# Patient Record
Sex: Male | Born: 1950 | Race: White | Hispanic: No | Marital: Married | Smoking: Current every day smoker
Health system: Southern US, Community
[De-identification: ages and names within clinical notes are randomized; demographics above are authoritative.]

---

## 2003-08-26 ENCOUNTER — Encounter: Payer: Self-pay | Admitting: Surgery

## 2003-09-01 ENCOUNTER — Ambulatory Visit (HOSPITAL_COMMUNITY): Admission: RE | Admit: 2003-09-01 | Discharge: 2003-09-01 | Payer: Self-pay | Admitting: Surgery

## 2011-12-20 ENCOUNTER — Ambulatory Visit (HOSPITAL_COMMUNITY)
Admission: RE | Admit: 2011-12-20 | Discharge: 2011-12-20 | Disposition: A | Discharge status: Home / Self Care | Payer: Self-pay | Source: Ambulatory Visit | Attending: Chiropractic Medicine | Admitting: Chiropractic Medicine

## 2011-12-20 ENCOUNTER — Other Ambulatory Visit (HOSPITAL_COMMUNITY): Payer: Self-pay | Admitting: Chiropractic Medicine

## 2011-12-20 DIAGNOSIS — M542 Cervicalgia: Secondary | ICD-10-CM

## 2011-12-20 DIAGNOSIS — M5030 Other cervical disc degeneration, unspecified cervical region: Secondary | ICD-10-CM | POA: Insufficient documentation

## 2012-05-18 ENCOUNTER — Ambulatory Visit
Admission: RE | Admit: 2012-05-18 | Discharge: 2012-05-18 | Disposition: A | Discharge status: Home / Self Care | Payer: Self-pay | Source: Ambulatory Visit | Attending: Geriatric Medicine | Admitting: Geriatric Medicine

## 2012-05-18 ENCOUNTER — Other Ambulatory Visit: Payer: Self-pay | Admitting: Geriatric Medicine

## 2012-05-18 DIAGNOSIS — F17200 Nicotine dependence, unspecified, uncomplicated: Secondary | ICD-10-CM

## 2015-06-08 ENCOUNTER — Telehealth: Payer: Self-pay | Admitting: Cardiovascular Disease

## 2015-06-08 NOTE — Telephone Encounter (Signed)
Received records from Dr Lerry Liner for appointment on 07/18/15 with Dr Allyson Sabal.  Records given to [facility] (medical records) for Dr Hazle Coca schedule on 07/18/15. lp

## 2015-07-18 ENCOUNTER — Encounter: Payer: Self-pay | Admitting: Cardiovascular Disease

## 2015-07-25 ENCOUNTER — Encounter: Payer: Self-pay | Admitting: Cardiovascular Disease

## 2018-04-08 ENCOUNTER — Emergency Department (HOSPITAL_COMMUNITY)
Admission: EM | Admit: 2018-04-08 | Discharge: 2018-04-08 | Disposition: A | Discharge status: Home / Self Care | Payer: Medicare HMO | Attending: Emergency Medicine | Admitting: Emergency Medicine

## 2018-04-08 ENCOUNTER — Encounter (HOSPITAL_COMMUNITY): Payer: Self-pay | Admitting: Emergency Medicine

## 2018-04-08 ENCOUNTER — Emergency Department (HOSPITAL_COMMUNITY): Payer: Medicare HMO

## 2018-04-08 DIAGNOSIS — F17210 Nicotine dependence, cigarettes, uncomplicated: Secondary | ICD-10-CM | POA: Insufficient documentation

## 2018-04-08 DIAGNOSIS — R1084 Generalized abdominal pain: Secondary | ICD-10-CM | POA: Diagnosis present

## 2018-04-08 LAB — URINALYSIS, ROUTINE W REFLEX MICROSCOPIC
Bacteria, UA: NONE SEEN
Bilirubin Urine: NEGATIVE
GLUCOSE, UA: NEGATIVE mg/dL
Ketones, ur: NEGATIVE mg/dL
Leukocytes, UA: NEGATIVE
Nitrite: NEGATIVE
PH: 6 (ref 5.0–8.0)
PROTEIN: NEGATIVE mg/dL
SPECIFIC GRAVITY, URINE: 1.011 (ref 1.005–1.030)

## 2018-04-08 LAB — COMPREHENSIVE METABOLIC PANEL
ALK PHOS: 78 U/L (ref 38–126)
ALT: 18 U/L (ref 17–63)
AST: 18 U/L (ref 15–41)
Albumin: 4.1 g/dL (ref 3.5–5.0)
Anion gap: 11 (ref 5–15)
BUN: 14 mg/dL (ref 6–20)
CALCIUM: 9.7 mg/dL (ref 8.9–10.3)
CHLORIDE: 101 mmol/L (ref 101–111)
CO2: 25 mmol/L (ref 22–32)
CREATININE: 0.94 mg/dL (ref 0.61–1.24)
GFR calc Af Amer: >60 mL/min (ref >60)
Glucose, Bld: 108 mg/dL — ABNORMAL HIGH (ref 65–99)
Potassium: 3.9 mmol/L (ref 3.5–5.1)
SODIUM: 137 mmol/L (ref 135–145)
Total Bilirubin: 0.7 mg/dL (ref 0.3–1.2)
Total Protein: 6.6 g/dL (ref 6.5–8.1)

## 2018-04-08 LAB — CBC
HCT: 39.2 % (ref 39.0–52.0)
Hemoglobin: 13.3 g/dL (ref 13.0–17.0)
MCH: 31.7 pg (ref 26.0–34.0)
MCHC: 33.9 g/dL (ref 30.0–36.0)
MCV: 93.3 fL (ref 78.0–100.0)
PLATELETS: 276 10*3/uL (ref 150–400)
RBC: 4.2 MIL/uL — ABNORMAL LOW (ref 4.22–5.81)
RDW: 13.4 % (ref 11.5–15.5)
WBC: 10.2 10*3/uL (ref 4.0–10.5)

## 2018-04-08 LAB — LIPASE, BLOOD: LIPASE: 26 U/L (ref 11–51)

## 2018-04-08 MED ORDER — IOPAMIDOL (ISOVUE-300) INJECTION 61%
INTRAVENOUS | Status: AC
  Filled 2018-04-08: qty 100

## 2018-04-08 MED ORDER — IOPAMIDOL (ISOVUE-300) INJECTION 61%
100.0000 mL | Freq: Once | INTRAVENOUS | Status: AC | PRN
  Administered 2018-04-08: 100 mL via INTRAVENOUS

## 2018-04-08 NOTE — ED Notes (Signed)
Patient transported to CT 

## 2018-04-08 NOTE — Discharge Instructions (Addendum)
Please read attached information. If you experience any new or worsening signs or symptoms please return to the emergency room for evaluation. Please follow-up with your primary care provider or specialist as discussed.  °

## 2018-04-08 NOTE — ED Notes (Signed)
Patient verbalizes understanding of discharge instructions. Opportunity for questioning and answers were provided. Armband removed by staff, pt discharged from ED ambulatory.   

## 2018-04-08 NOTE — ED Provider Notes (Signed)
[facility] EMERGENCY DEPARTMENT Provider Note   CSN: [id number] Arrival date & time: 04/08/18  1131     History   Chief Complaint Chief Complaint  Patient presents with  . Abdominal Pain    HPI Fernando Lynn is a 67 y.o. male.  HPI   67  Year old male presents today with complaints of right lower quadrant abdominal pain. Patient notes that over the last several days he's had vague discomfort in the right lower abdomen. The patient denies any nausea or vomiting, denies any changes in urine or bowel habits or characteristics. Patient denies any fever. Patient does report a history of hernia repair in the past but denies any acute pain in his groin, denies any testicular penile or rectal pain.  History reviewed. No pertinent past medical history.  There are no active problems to display for this patient.   History reviewed. No pertinent surgical history.      Home Medications    Prior to Admission medications   Not on File    Family History History reviewed. No pertinent family history.  Social History Social History   Tobacco Use  . Smoking status: Current Every Day Smoker    Packs/day: 1.00    Types: Cigarettes  . Smokeless tobacco: Never Used  Substance Use Topics  . Alcohol use: Never    Frequency: Never  . Drug use: Not on file     Allergies   Patient has no allergy information on record.   Review of Systems Review of Systems  All other systems reviewed and are negative.    Physical Exam Updated Vital Signs BP 133/66   Pulse (!) 56   Temp 98.1 F (36.7 C) (Oral)   Resp 18   SpO2 97%   Physical Exam  Constitutional: He is oriented to person, place, and time. He appears well-developed and well-nourished.  HENT:  Head: Normocephalic and atraumatic.  Eyes: Pupils are equal, round, and reactive to light. Conjunctivae are normal. Right eye exhibits no discharge. Left eye exhibits no discharge. No scleral icterus.  Neck:  Normal range of motion. No JVD present. No tracheal deviation present.  Pulmonary/Chest: Effort normal. No stridor.  Abdominal: Soft. He exhibits no distension and no mass. There is tenderness. There is no rebound and no guarding. No hernia.  Tenderness palpation or right lower quadrant, remainder of abdominal exam without acute findings- no pulsatile masses  Neurological: He is alert and oriented to person, place, and time. Coordination normal.  Psychiatric: He has a normal mood and affect. His behavior is normal. Judgment and thought content normal.  Nursing note and vitals reviewed.   ED Treatments / Results  Labs (all labs ordered are listed, but only abnormal results are displayed) Labs Reviewed  COMPREHENSIVE METABOLIC PANEL - Abnormal; Notable for the following components:      Result Value   Glucose, Bld 108 (*)    All other components within normal limits  CBC - Abnormal; Notable for the following components:   RBC 4.20 (*)    All other components within normal limits  URINALYSIS, ROUTINE W REFLEX MICROSCOPIC - Abnormal; Notable for the following components:   Hgb urine dipstick SMALL (*)    Squamous Epithelial / LPF 0-5 (*)    All other components within normal limits  LIPASE, BLOOD    EKG None  Radiology Ct Abdomen Pelvis W Contrast  Result Date: 04/08/2018 CLINICAL DATA:  Right lower quadrant abdominal pain over the last 3  days. EXAM: CT ABDOMEN AND PELVIS WITH CONTRAST TECHNIQUE: Multidetector CT imaging of the abdomen and pelvis was performed using the standard protocol following bolus administration of intravenous contrast. CONTRAST:  ISOVUE-300 IOPAMIDOL (ISOVUE-300) INJECTION 61% COMPARISON:  None. FINDINGS: Lower chest: Normal Hepatobiliary: Multiple benign appearing liver cysts. A few scattered hepatic low densities that are too small to characterize, but in the setting of the multiple other hepatic cysts, also likely small cysts. No calcified gallstones.  Pancreas: Normal Spleen: Normal Adrenals/Urinary Tract: Right adrenal gland is normal. Left adrenal gland contains a 2.7 cm low-density mass consistent with a benign adenoma. Left kidney is chronically atrophic. There is atherosclerotic calcification of the arterial system. No evidence of obstruction or focal lesion. Right kidney shows compensatory hypertrophy. Arterial system calcification is also noted on this side. No evidence of mass or obstruction. Neither ureter shows a stone. No stone in the bladder. Stomach/Bowel: Normal appearing appendix. No acute bowel pathology. Moderate amount of fecal matter in the colon, within normal limits. Vascular/Lymphatic: Aortic atherosclerosis. No aneurysm. IVC is normal. No retroperitoneal adenopathy. Reproductive: Normal Other: No free fluid or air. It appears that the patient may have had a previous inguinal hernia repair on the right. If that is not the case, there could be a small Spignelian or inguinal hernia in that region. Musculoskeletal: Negative IMPRESSION: Normal appearing appendix. Right inguinal region appears abnormal. Has the patient had a previous inguinal hernia repair? If so, the appearance may be normal following that. If not, the patient could have a small inguinal hernia or Spignelian hernia with mild inflammation. Chronic left renal atrophy.  No acute renal pathology seen. Aortic atherosclerosis. Atherosclerotic change of the aortic branch vessels including the renal arteries. Electronically Signed   By: Paulina Fusi M.D.   On: 04/08/2018 15:06    Procedures Procedures (including critical care time)  Medications Ordered in ED Medications  iopamidol (ISOVUE-300) 61 % injection (has no administration in time range)  iopamidol (ISOVUE-300) 61 % injection 100 mL (100 mLs Intravenous Contrast Given 04/08/18 1442)     Initial Impression / Assessment and Plan / ED Course  I have reviewed the triage vital signs and the nursing notes.  Pertinent  labs & imaging results that were available during my care of the patient were reviewed by me and considered in my medical decision making (see chart for details).    Labs: Urinalysis, lipase, CMP, CBC  Imaging: CT abdomen pelvis with contrast  Consults:  Therapeutics:  Discharge Meds:   Assessment/Plan: 67 year old male presents today with abdominal pain.  Patient is a very reassuring workup here he has no signs of infectious etiology on his labs or imaging study.  He does have history of hernia repair in the right inguinal region, no acute hernia today or tenderness to palpation there.  Upon repeat abdominal exam patient is tender in the bilateral upper quadrants very minimally with no rebound or guarding.  His pain is migrating, he has no signs of dissection or significant vascular etiology.  No signs of appendicitis diverticulitis or any acute intra-abdominal pathology.  Patient will be encouraged to follow-up as an outpatient return immediately to the emergency room with any new or worsening signs or symptoms.  He verbalized understanding and agreement to today's plan had no further questions or concerns at the time of discharge.    Final Clinical Impressions(s) / ED Diagnoses   Final diagnoses:  Generalized abdominal pain    ED Discharge Orders    None  Eyvonne MechanicHedges, Ethelle Ola, PA-C 04/08/18 1723    Mancel BaleWentz, Elliott, MD 04/09/18 [phone]

## 2018-04-08 NOTE — ED Triage Notes (Signed)
Pt to ER for evaluation of RLQ abdominal pain x3 days with loose stool. Pt denies n/v. Patient is a/o x4. NAD. [facility]

## 2018-08-30 DEATH — deceased

## 2019-11-21 IMAGING — CT CT ABD-PELV W/ CM
2 of 5 series · 16 of 46 positions shown, 18 images · IV contrast (iopamidol)
Comparison: None.

CLINICAL DATA: Right lower quadrant abdominal pain over the last 3
days.

EXAM:
CT ABDOMEN AND PELVIS WITH CONTRAST
TECHNIQUE: Multidetector CT imaging of the abdomen and pelvis was performed
using the standard protocol following bolus administration of
intravenous contrast.
CONTRAST:  100mL YDFPTE-BJJ IOPAMIDOL (YDFPTE-BJJ) INJECTION 61%

[Series 3: a/p w/ 5mm · axial · 0.79mm/px · z∈[+806,+1186]mm · 13 of 88 slices shown, 15 images]
[im 6/88  soft-tissue]
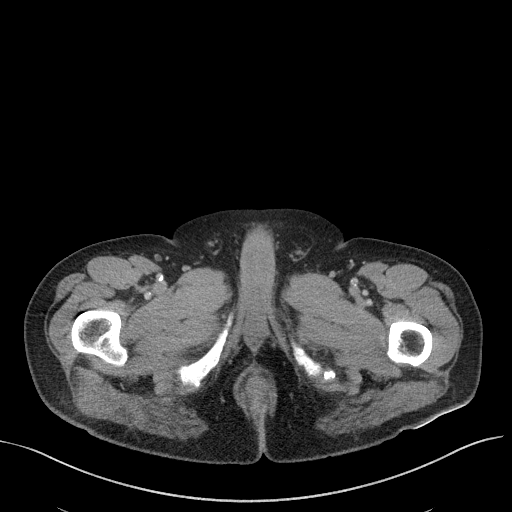
[im 6/88  bone]
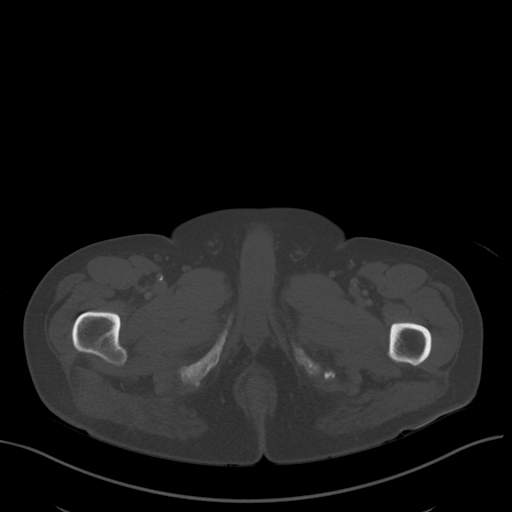
[im 11/88  soft-tissue]
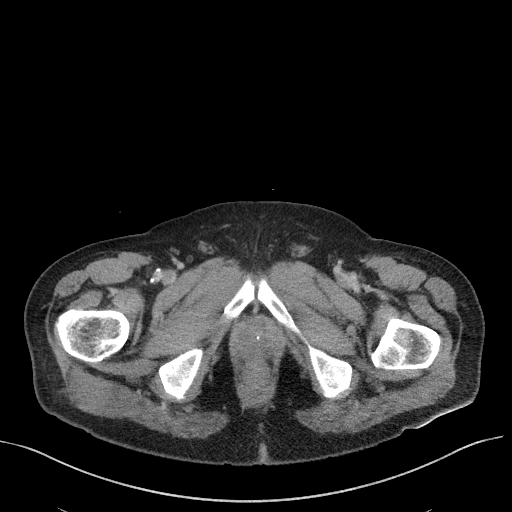
[im 21/88  soft-tissue]
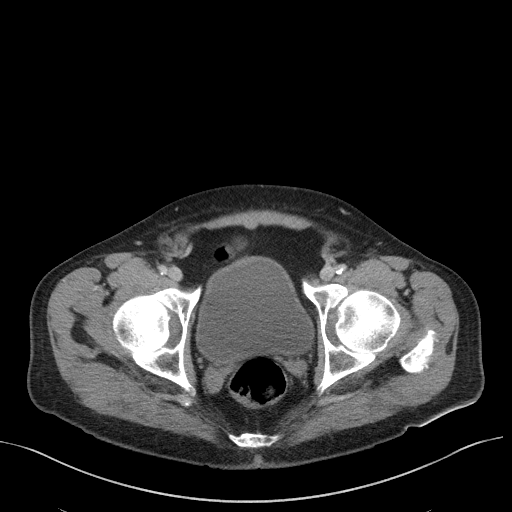
[im 26/88  soft-tissue]
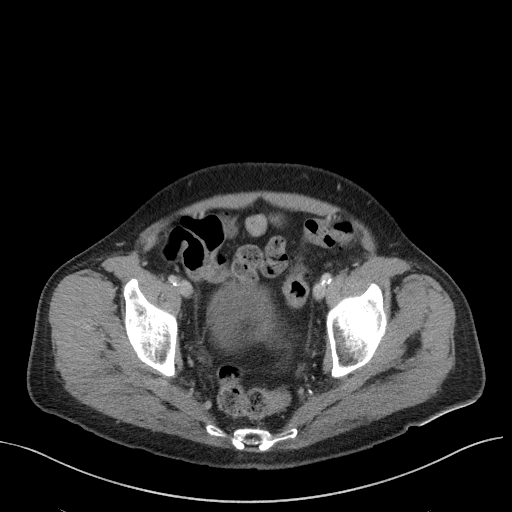
[im 31/88  soft-tissue]
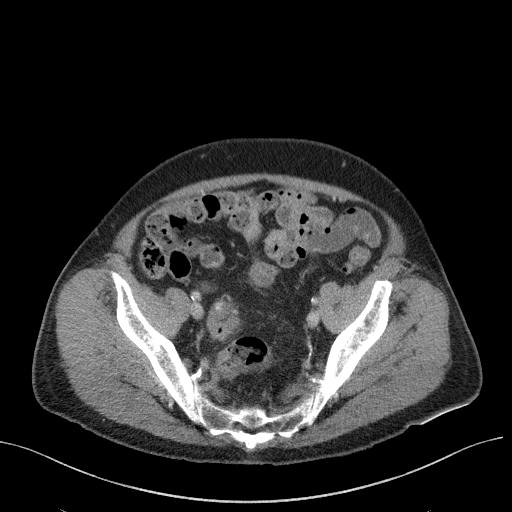
[im 36/88  soft-tissue]
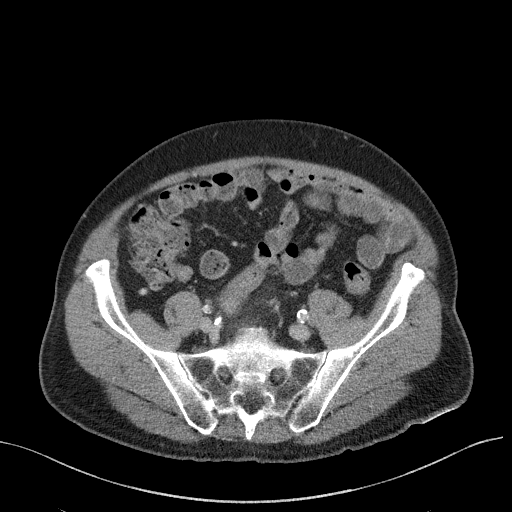
[im 47/88  soft-tissue]
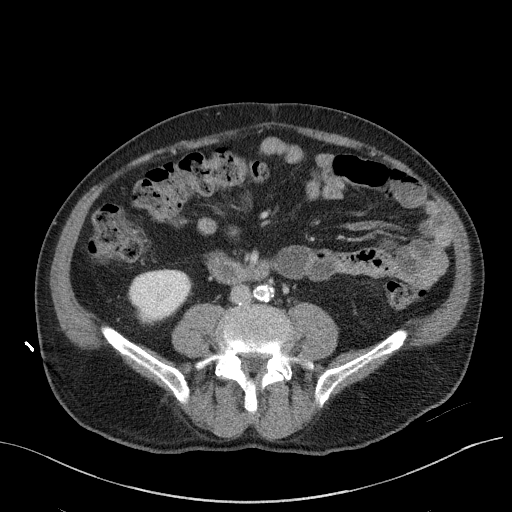
[im 52/88  soft-tissue]
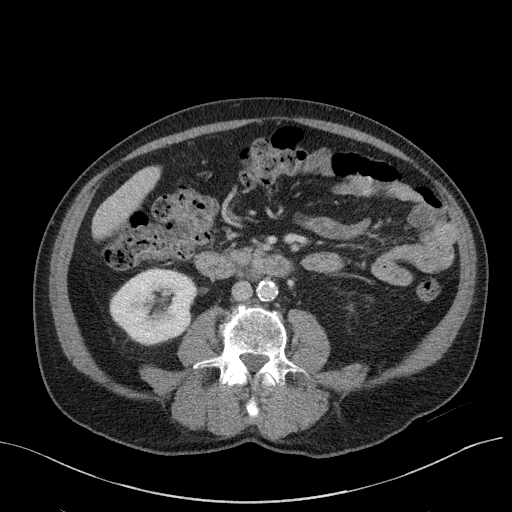
[im 57/88  soft-tissue]
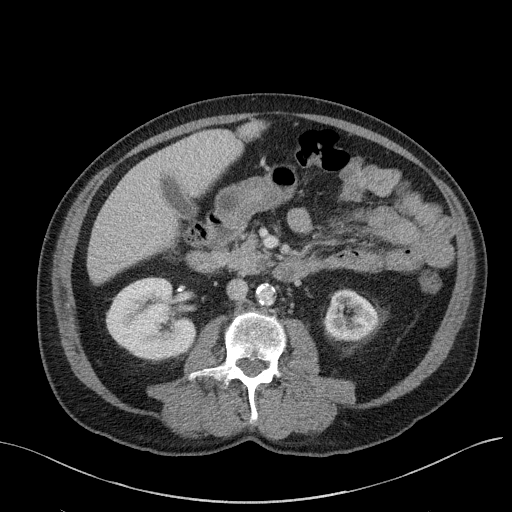
[im 57/88  bone]
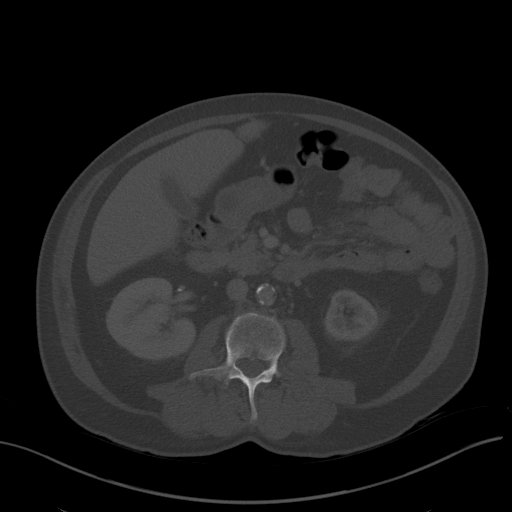
[im 62/88  soft-tissue]
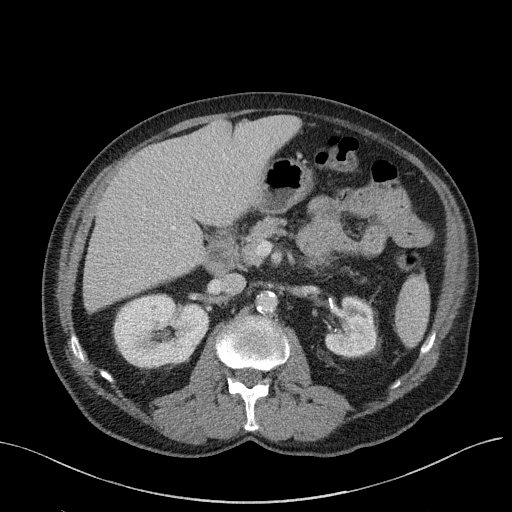
[im 67/88  soft-tissue]
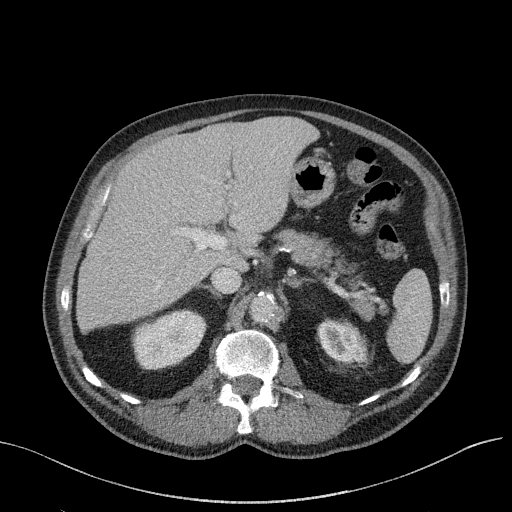
[im 77/88  soft-tissue]
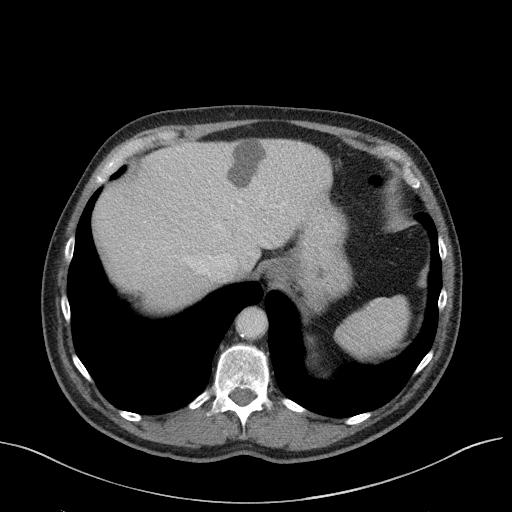
[im 82/88  soft-tissue]
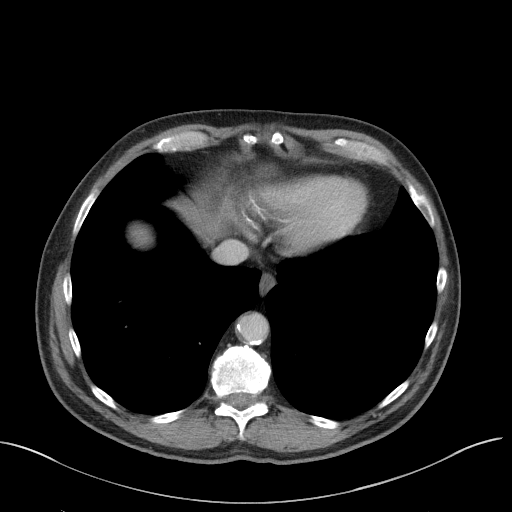

[Series 7: a/p w/ cor · coronal · 0.77mm/px · 3 of 150 slices shown]
[im 50/150  soft-tissue]
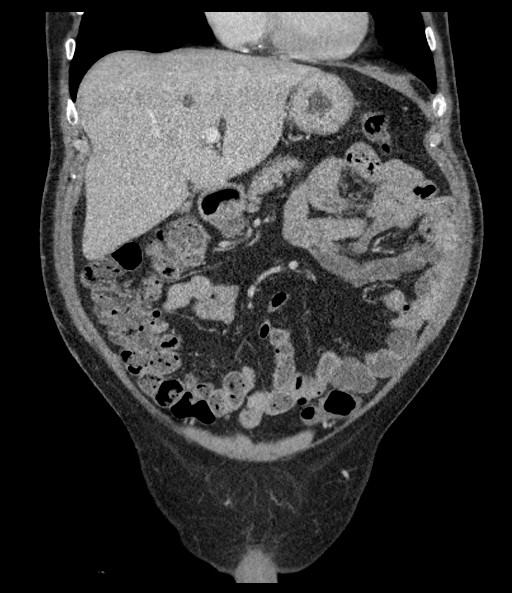
[im 67/150  soft-tissue]
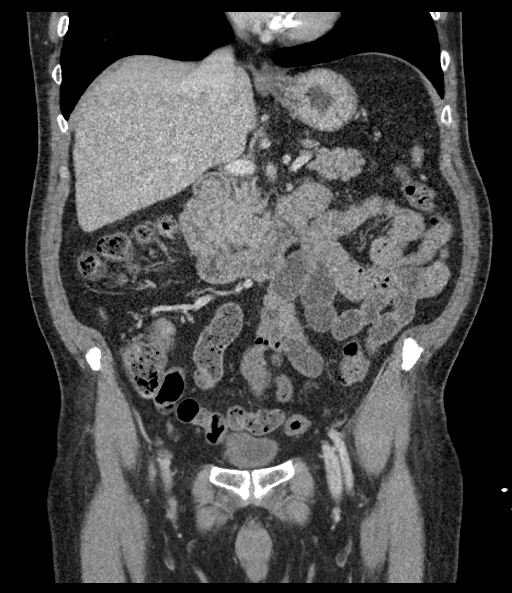
[im 83/150  soft-tissue]
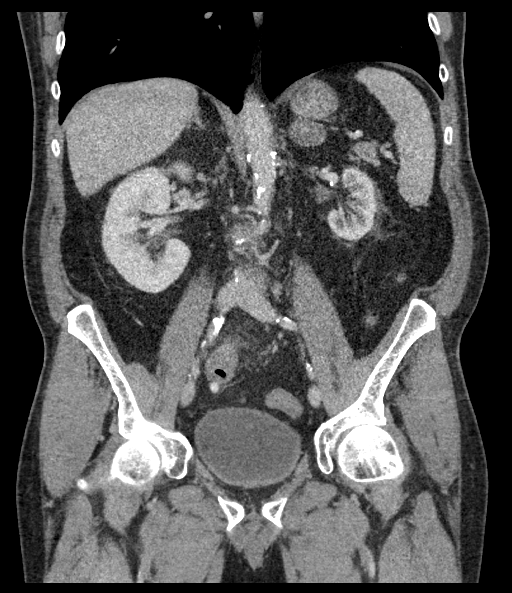

[16 of 46 positions shown; findings below may reference images not displayed]

FINDINGS: Lower chest: Normal

Hepatobiliary: Multiple benign appearing liver cysts. A few
scattered hepatic low densities that are too small to characterize,
but in the setting of the multiple other hepatic cysts, also likely
small cysts. No calcified gallstones.

Pancreas: Normal

Spleen: Normal

Adrenals/Urinary Tract: Right adrenal gland is normal. Left adrenal
gland contains a 2.7 cm low-density mass consistent with a benign
adenoma. Left kidney is chronically atrophic. There is
atherosclerotic calcification of the arterial system. No evidence of
obstruction or focal lesion. Right kidney shows compensatory
hypertrophy. Arterial system calcification is also noted on this
side. No evidence of mass or obstruction. Neither ureter shows a
stone. No stone in the bladder.

Stomach/Bowel: Normal appearing appendix. No acute bowel pathology.
Moderate amount of fecal matter in the colon, within normal limits.

Vascular/Lymphatic: Aortic atherosclerosis. No aneurysm. IVC is
normal. No retroperitoneal adenopathy.

Reproductive: Normal

Other: No free fluid or air. It appears that the patient may have
had a previous inguinal hernia repair on the right. If that is not
the case, there could be a small Spignelian or inguinal hernia in
that region.

Musculoskeletal: Negative
IMPRESSION: Normal appearing appendix.

Right inguinal region appears abnormal. Has the patient had a
previous inguinal hernia repair? If so, the appearance may be normal
following that. If not, the patient could have a small inguinal
hernia or Spignelian hernia with mild inflammation.

Chronic left renal atrophy.  No acute renal pathology seen.

Aortic atherosclerosis. Atherosclerotic change of the aortic branch
vessels including the renal arteries.
